# Patient Record
Sex: Female | Born: 1950 | Race: White | Hispanic: No | Smoking: Never smoker
Health system: Southern US, Community
[De-identification: ages and names within clinical notes are randomized; demographics above are authoritative.]

---

## 2020-07-31 ENCOUNTER — Ambulatory Visit (INDEPENDENT_AMBULATORY_CARE_PROVIDER_SITE_OTHER): Payer: MEDICARE

## 2020-07-31 ENCOUNTER — Other Ambulatory Visit: Payer: Self-pay

## 2020-07-31 ENCOUNTER — Ambulatory Visit
Admission: EM | Admit: 2020-07-31 | Discharge: 2020-07-31 | Disposition: A | Discharge status: Home / Self Care | Payer: MEDICARE

## 2020-07-31 DIAGNOSIS — J069 Acute upper respiratory infection, unspecified: Secondary | ICD-10-CM

## 2020-07-31 DIAGNOSIS — R0602 Shortness of breath: Secondary | ICD-10-CM

## 2020-07-31 DIAGNOSIS — R059 Cough, unspecified: Secondary | ICD-10-CM | POA: Diagnosis not present

## 2020-07-31 MED ORDER — CETIRIZINE HCL 10 MG PO TABS: 10.0000 mg | ORAL_TABLET | Freq: Every day | ORAL | 0 refills | Status: AC

## 2020-07-31 MED ORDER — FLUTICASONE PROPIONATE 50 MCG/ACT NA SUSP: 2.0000 | Freq: Every day | NASAL | 0 refills | Status: AC

## 2020-07-31 MED ORDER — PREDNISONE 20 MG PO TABS: 20.0000 mg | ORAL_TABLET | Freq: Two times a day (BID) | ORAL | 0 refills | Status: AC

## 2020-07-31 NOTE — Discharge Instructions (Addendum)
Declines covid/ flu test at this time X-ray negative for fracture or dislocation Get plenty of rest and push fluids Prednisone prescribed.  Take as directed and to completion Use zyrtec for nasal congestion, runny nose, and/or sore throat Use flonase for nasal congestion and runny nose Use medications daily for symptom relief Use OTC medications like ibuprofen or tylenol as needed fever or pain Call or go to the ED if you have any new or worsening symptoms such as fever, worsening cough, shortness of breath, chest tightness, chest pain, turning blue, changes in mental status, etc..Marland Kitchen

## 2020-07-31 NOTE — ED Provider Notes (Signed)
[facility]   [id number] 07/31/20 Arrival Time: 1514   CC: Cough  SUBJECTIVE: History from: patient.  Jennifer Robertson is a 70 y.o. female who presents with fatigue and cough x 2 days.  Denies sick exposure to COVID, flu or strep.  Admits to recent travel.  Has tried OTC medications without relief.  Symptoms are made worse at night.  Reports previous symptoms in the past with [facility].   Denies fever, chills, sinus pain, rhinorrhea, sore throat, SOB, wheezing, chest pain, nausea, changes in bowel or bladder habits.    ROS: As per HPI.  All other pertinent ROS negative.     History reviewed. No pertinent past medical history. History reviewed. No pertinent surgical history. No Known Allergies No current facility-administered medications on file prior to encounter.   Current Outpatient Medications on File Prior to Encounter  Medication Sig Dispense Refill   atorvastatin (LIPITOR) 20 MG tablet Take 1 tablet by mouth daily.     clopidogrel (PLAVIX) 75 MG tablet Take 1 tablet by mouth daily.     metFORMIN (GLUCOPHAGE-XR) 500 MG 24 hr tablet Take 500 mg by mouth 2 (two) times daily.     RYBELSUS 14 MG TABS Take 1 tablet by mouth daily.     sertraline (ZOLOFT) 25 MG tablet Take 25 mg by mouth daily.     SYNTHROID 50 MCG tablet Take 50 mcg by mouth daily.     triamterene-hydrochlorothiazide (MAXZIDE-25) 37.5-25 MG tablet Take by mouth.     Social History   Socioeconomic History   Marital status: Divorced    Spouse name: Not on file   Number of children: Not on file   Years of education: Not on file   Highest education level: Not on file  Occupational History   Not on file  Tobacco Use   Smoking status: Never   Smokeless tobacco: Not on file  Substance and Sexual Activity   Alcohol use: Not Currently   Drug use: Not on file   Sexual activity: Not on file  Other Topics Concern   Not on file  Social History Narrative   Not on file   Social Determinants of Health    Financial Resource Strain: Not on file  Food Insecurity: Not on file  Transportation Needs: Not on file  Physical Activity: Not on file  Stress: Not on file  Social Connections: Not on file  Intimate Partner Violence: Not on file   History reviewed. No pertinent family history.  OBJECTIVE:  Vitals:   07/31/20 1600  BP: (!) 146/81  Pulse: (!) 111  Resp: 20  Temp: 99 F (37.2 C)  SpO2: 95%     General appearance: alert; appears fatigued, but nontoxic; speaking in full sentences and tolerating own secretions HEENT: NCAT; Ears: EACs clear, TMs pearly gray; Eyes: PERRL.  EOM grossly intact. Nose: nares patent without rhinorrhea, Throat: oropharynx clear, tonsils non erythematous or enlarged, uvula midline  Neck: supple without LAD Lungs: unlabored respirations, symmetrical air entry; cough: mild; no respiratory distress; CTAB Heart: regular rate and rhythm.   Skin: warm and dry Psychological: alert and cooperative; normal mood and affect  DIAGNOSTIC STUDIES:  DG Chest 2 View  Result Date: 07/31/2020 CLINICAL DATA:  Cough and short of breath EXAM: CHEST - 2 VIEW COMPARISON:  None. FINDINGS: The heart size and mediastinal contours are within normal limits. Both lungs are clear. The visualized skeletal structures are unremarkable. IMPRESSION: No active cardiopulmonary disease. Electronically Signed   By: Jasmine Pang  M.D.   On: 07/31/2020 16:25     X-rays negative for CARDIOPULMONARY DISEASE  I have reviewed the x-rays myself and the radiologist interpretation. I am in agreement with the radiologist interpretation.     ASSESSMENT & PLAN:  1. Viral URI with cough     Meds ordered this encounter  Medications   cetirizine (ZYRTEC) 10 MG tablet    Sig: Take 1 tablet (10 mg total) by mouth daily.    Dispense:  30 tablet    Refill:  0    Order Specific Question:   Supervising Provider    Answer:   Eustace Moore [[id number]]   fluticasone (FLONASE) 50 MCG/ACT nasal spray     Sig: Place 2 sprays into both nostrils daily.    Dispense:  16 g    Refill:  0    Order Specific Question:   Supervising Provider    Answer:   Eustace Moore [[id number]]   predniSONE (DELTASONE) 20 MG tablet    Sig: Take 1 tablet (20 mg total) by mouth 2 (two) times daily with a meal for 5 days.    Dispense:  10 tablet    Refill:  0    Order Specific Question:   Supervising Provider    Answer:   Eustace Moore [[id number]]   Declines covid/ flu test at this time X-ray negative for fracture or dislocation Get plenty of rest and push fluids Prednisone prescribed.  Take as directed and to completion Use zyrtec for nasal congestion, runny nose, and/or sore throat Use lonase for nasal congestion and runny nose Use medications daily for symptom relief Use OTC medications like ibuprofen or tylenol as needed fever or pain Call or go to the ED if you have any new or worsening symptoms such as fever, worsening cough, shortness of breath, chest tightness, chest pain, turning blue, changes in mental status, etc...   Reviewed expectations re: course of current medical issues. Questions answered. Outlined signs and symptoms indicating need for more acute intervention. Patient verbalized understanding. After Visit Summary given.          Rennis Harding, PA-C 07/31/20 1650

## 2020-07-31 NOTE — ED Triage Notes (Signed)
Pt presents with c/o cough and fatigue, states she feels like she did when she had pneumonia

## 2022-09-27 IMAGING — DX DG CHEST 2V
2 series · 2 of 2 positions shown · non-contrast
Comparison: None.

CLINICAL DATA: Cough and short of breath

EXAM:
CHEST - 2 VIEW

[chest pa]
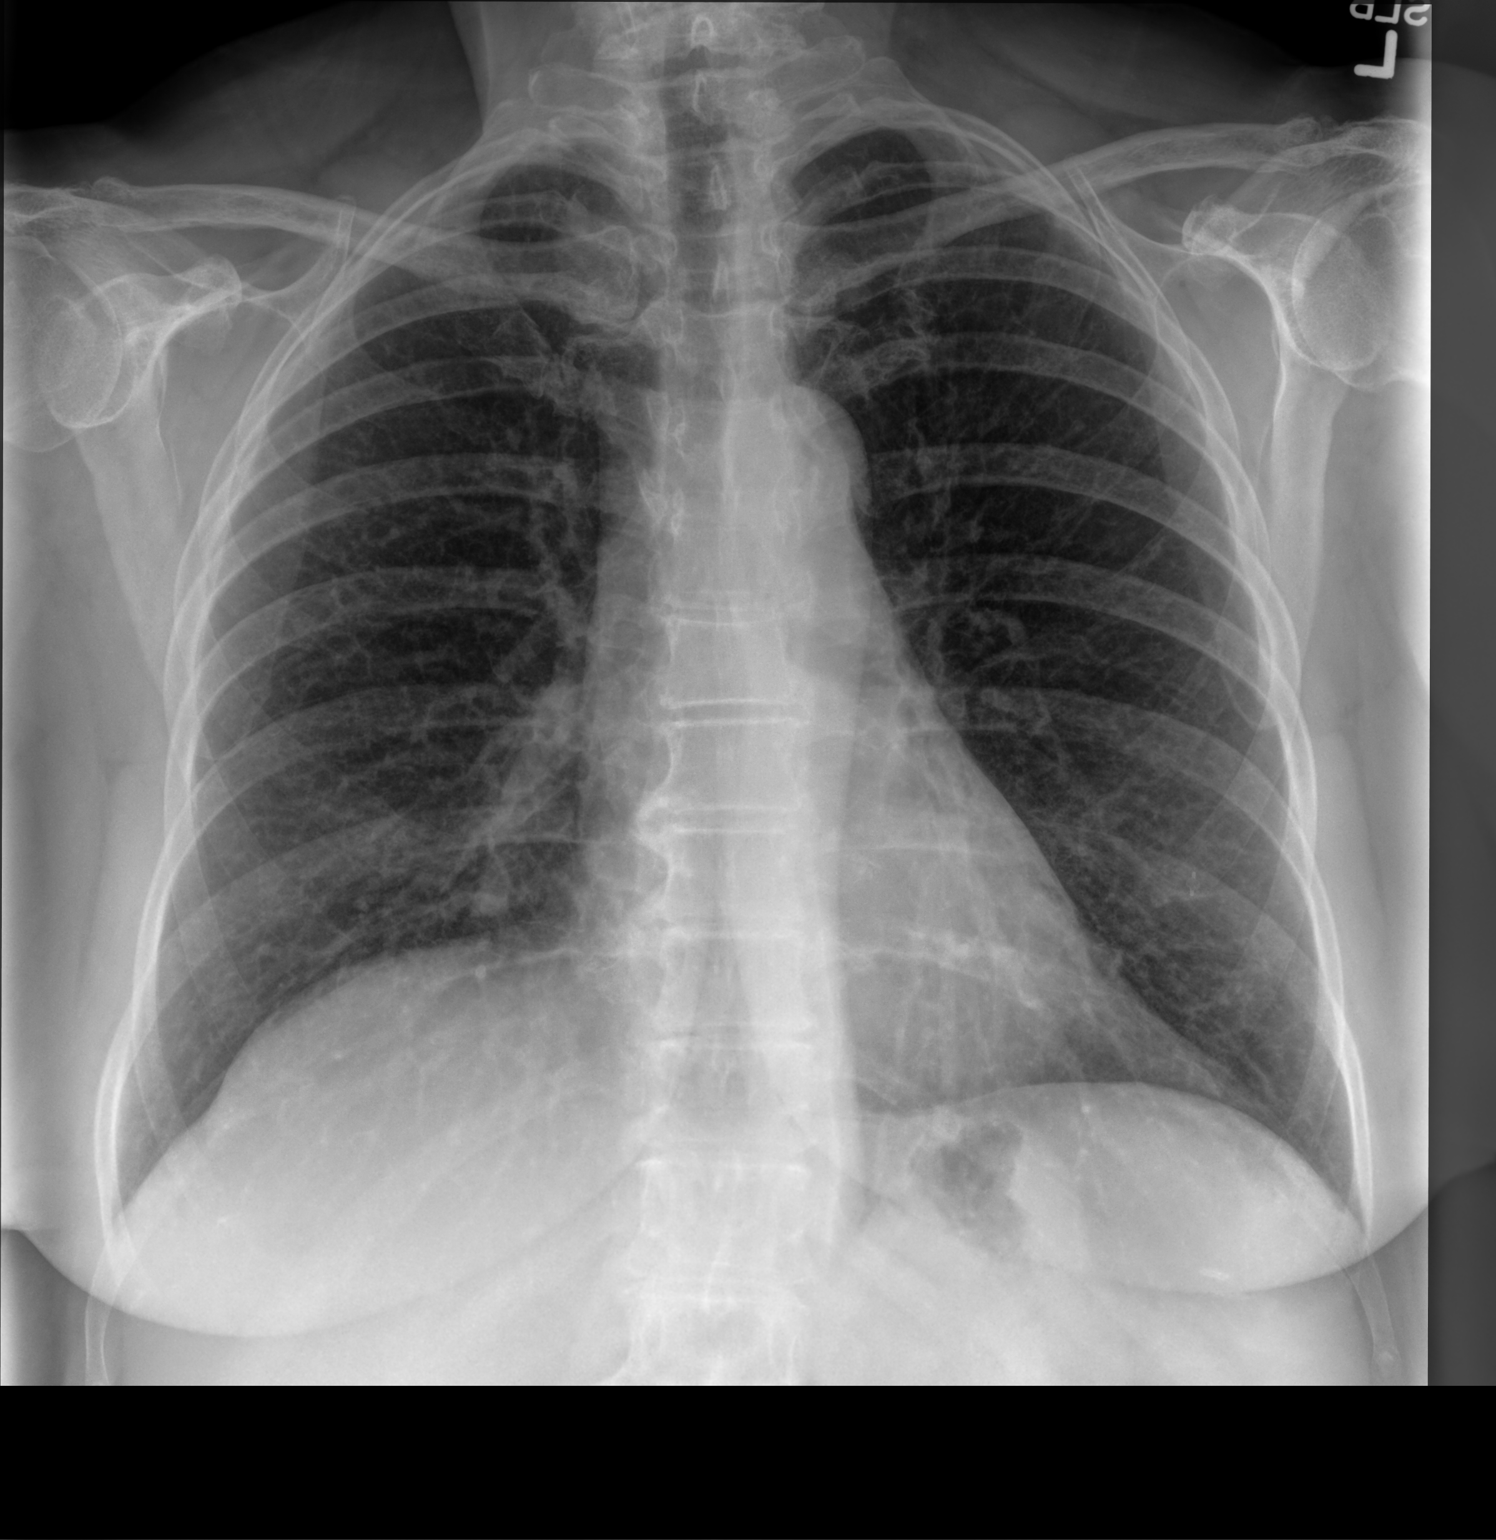

[chest lat]
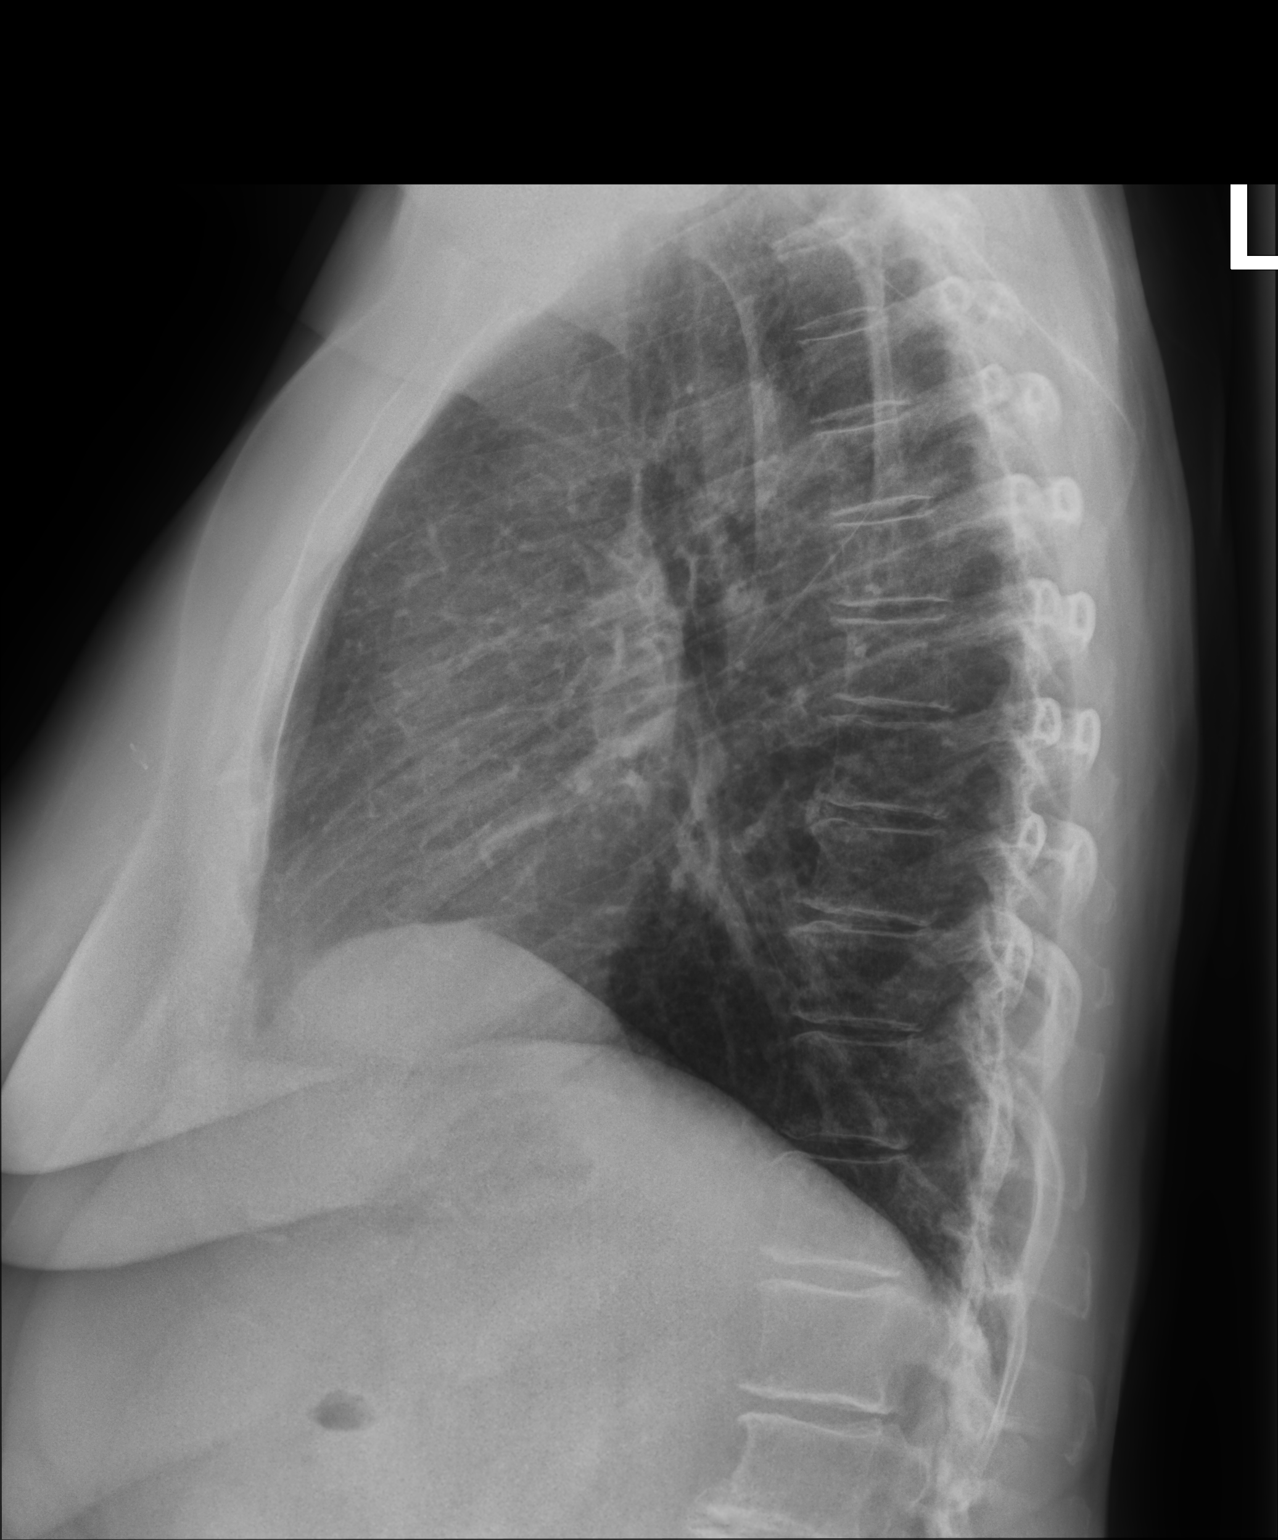

[2 of 2 positions shown; findings below may reference images not displayed]

FINDINGS: The heart size and mediastinal contours are within normal limits.
Both lungs are clear. The visualized skeletal structures are
unremarkable.
IMPRESSION: No active cardiopulmonary disease.
